# Patient Record
Sex: Female | Born: 1970 | Race: Black or African American | Hispanic: No | Marital: Married | Smoking: Never smoker
Health system: Southern US, Community
[De-identification: ages and names within clinical notes are randomized; demographics above are authoritative.]

## PROBLEM LIST (undated history)

## (undated) HISTORY — PX: WISDOM TOOTH EXTRACTION: SHX21

## (undated) HISTORY — PX: TOOTH EXTRACTION: SUR596

## (undated) HISTORY — PX: FOOT SURGERY: SHX648

---

## 2001-12-29 ENCOUNTER — Emergency Department (HOSPITAL_COMMUNITY): Admission: EM | Admit: 2001-12-29 | Discharge: 2001-12-29 | Payer: Self-pay | Admitting: *Deleted

## 2002-03-27 ENCOUNTER — Inpatient Hospital Stay (HOSPITAL_COMMUNITY): Admission: EM | Admit: 2002-03-27 | Discharge: 2002-03-27 | Payer: Self-pay

## 2004-11-26 ENCOUNTER — Emergency Department (HOSPITAL_COMMUNITY): Admission: EM | Admit: 2004-11-26 | Discharge: 2004-11-26 | Payer: Self-pay

## 2006-10-28 ENCOUNTER — Emergency Department (HOSPITAL_COMMUNITY): Admission: EM | Admit: 2006-10-28 | Discharge: 2006-10-28 | Payer: Self-pay | Admitting: Family Medicine

## 2006-10-31 ENCOUNTER — Emergency Department (HOSPITAL_COMMUNITY): Admission: EM | Admit: 2006-10-31 | Discharge: 2006-10-31 | Payer: Self-pay | Admitting: Family Medicine

## 2006-11-03 ENCOUNTER — Ambulatory Visit (HOSPITAL_COMMUNITY): Admission: RE | Admit: 2006-11-03 | Discharge: 2006-11-03 | Payer: Self-pay | Admitting: Obstetrics & Gynecology

## 2008-02-20 IMAGING — US US PELVIS COMPLETE MODIFY
1 series · 18 of 25 positions shown · non-contrast
Comparison: none

CLINICAL DATA: Evaluate for fibroids.  LMP 10/17/05. 
 TRANSABDOMINAL AND TRANSVAGINAL PELVIC ULTRASOUND:
TECHNIQUE: Both transabdominal and transvaginal ultrasound examinations of the pelvis were performed including evaluation of the uterus, ovaries, adnexal regions, and pelvic cul-de-sac.

[Series 1: us pelvis complete modify · 18 of 62 slices shown]
[im 1/62]
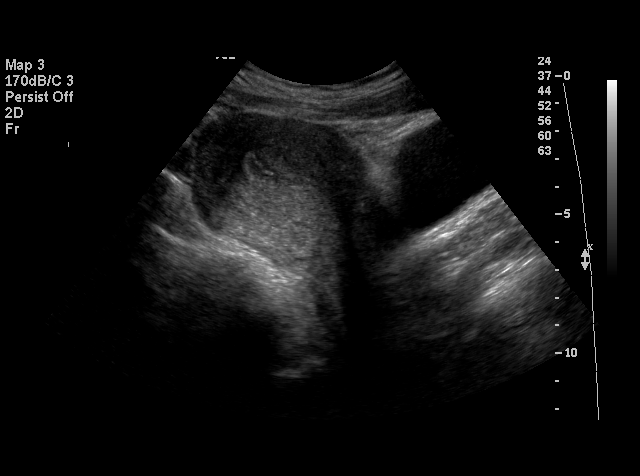
[im 6/62]
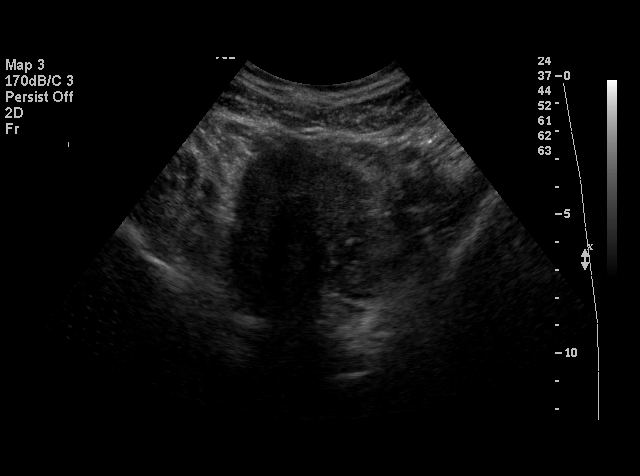
[im 8/62]
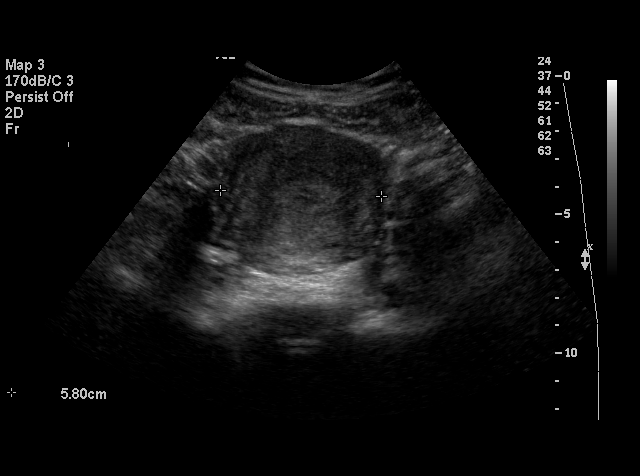
[im 11/62]
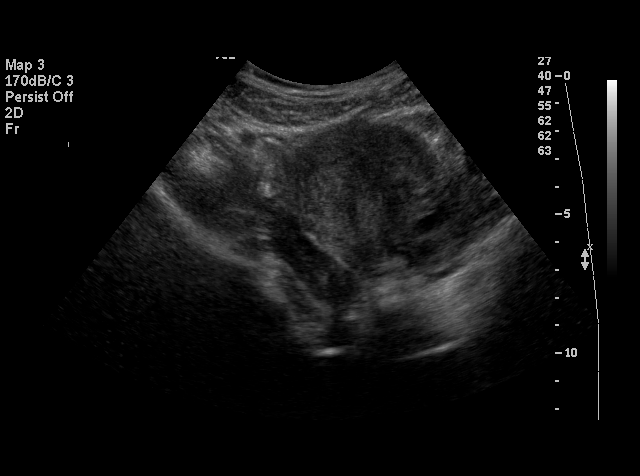
[im 16/62]
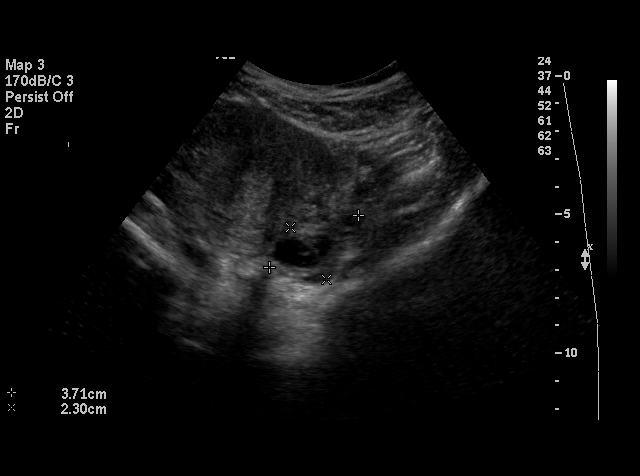
[im 18/62]
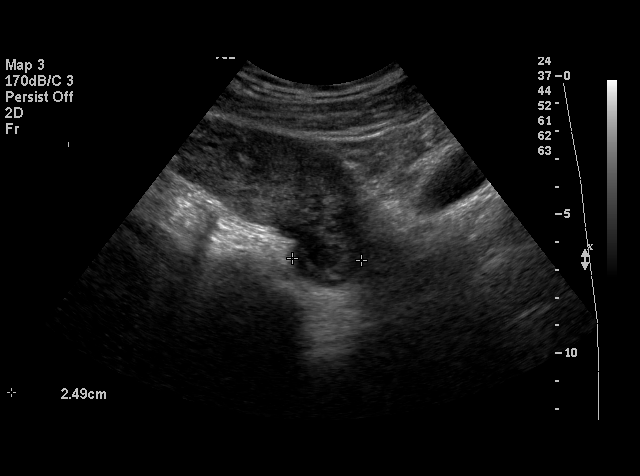
[im 23/62]
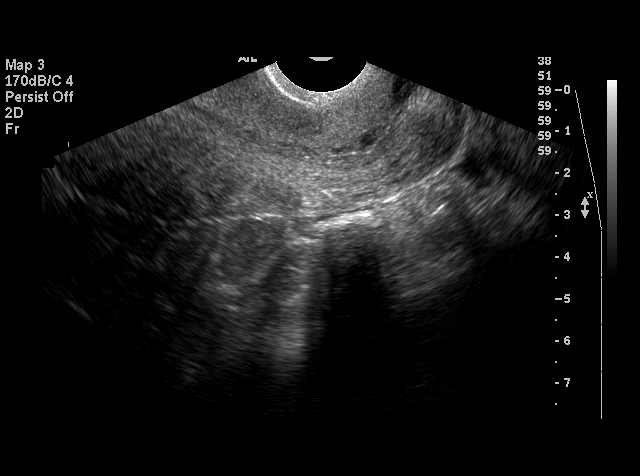
[im 26/62]
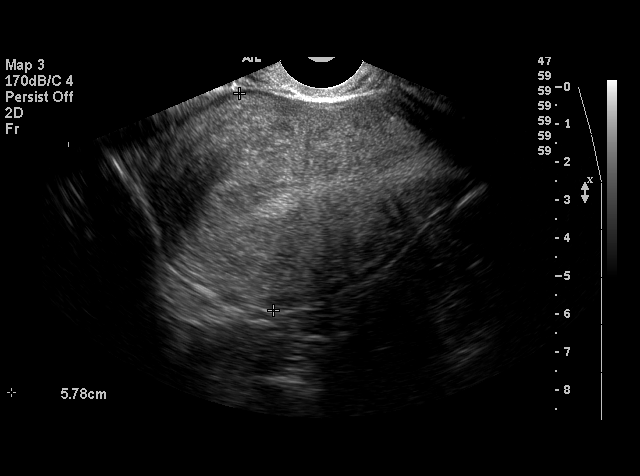
[im 28/62]
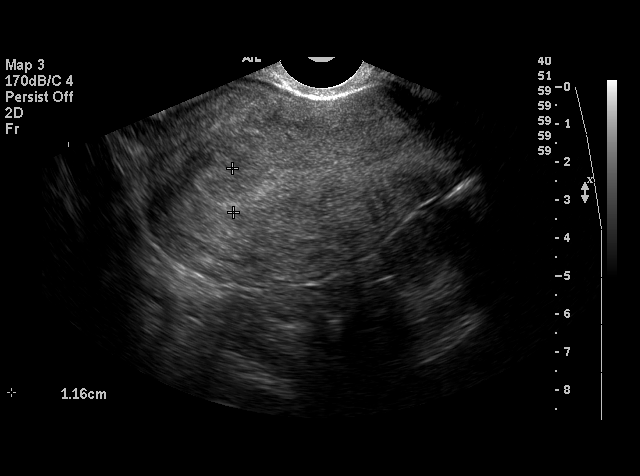
[im 34/62]
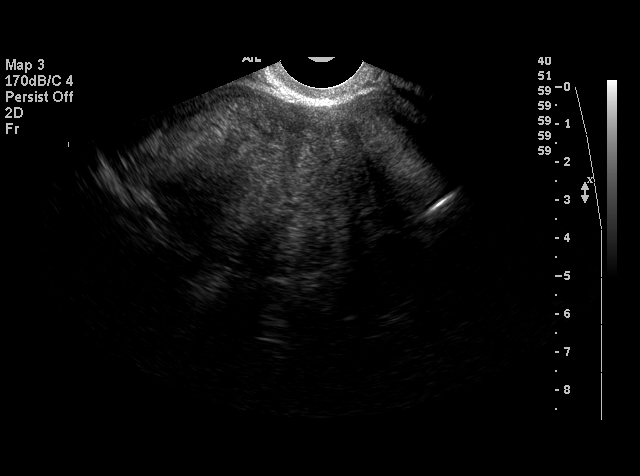
[im 36/62]
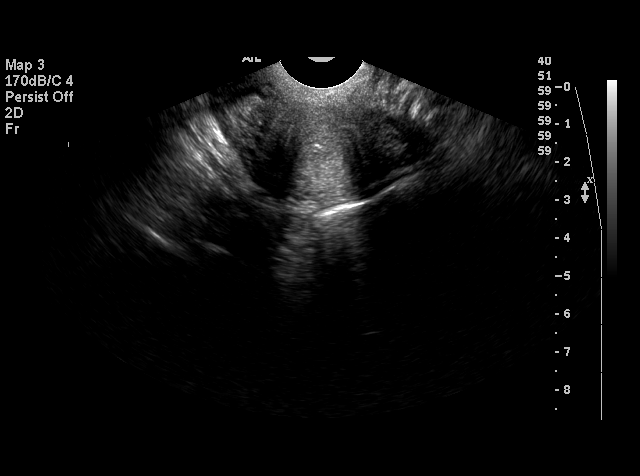
[im 39/62]
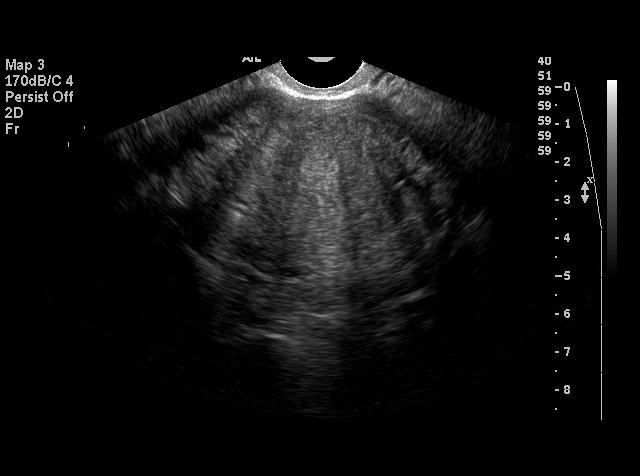
[im 44/62]
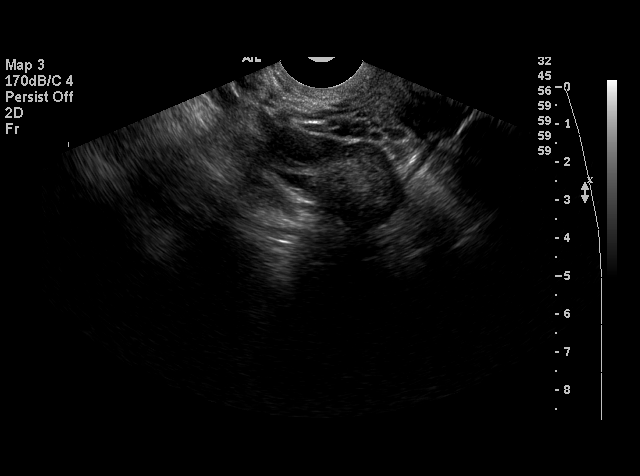
[im 46/62]
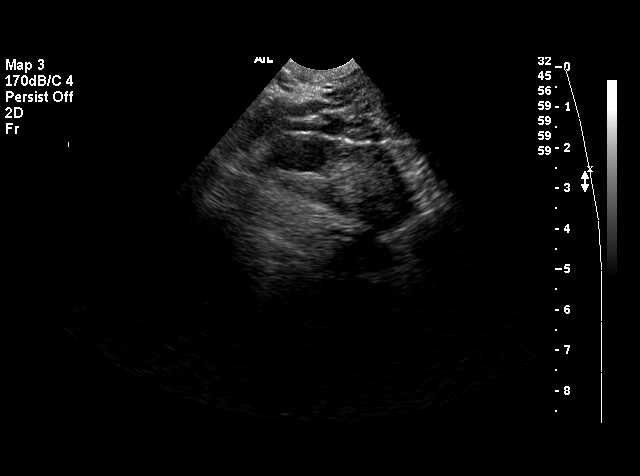
[im 51/62]
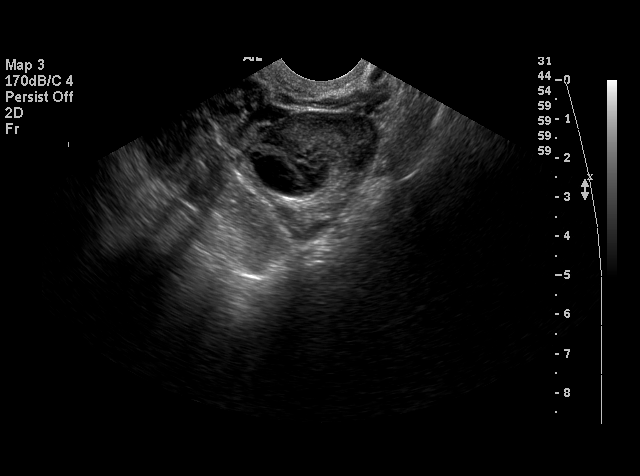
[im 54/62]
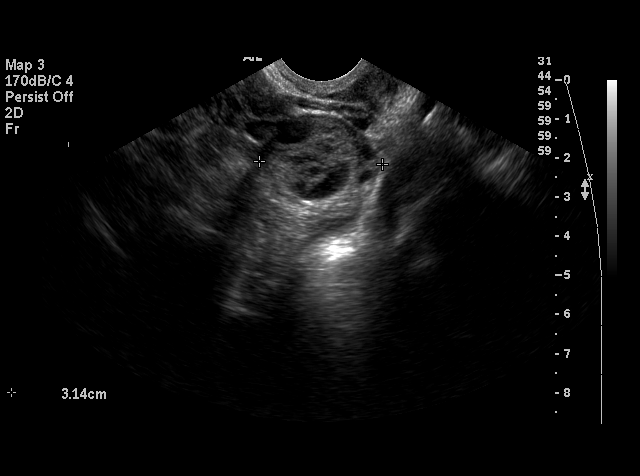
[im 56/62]
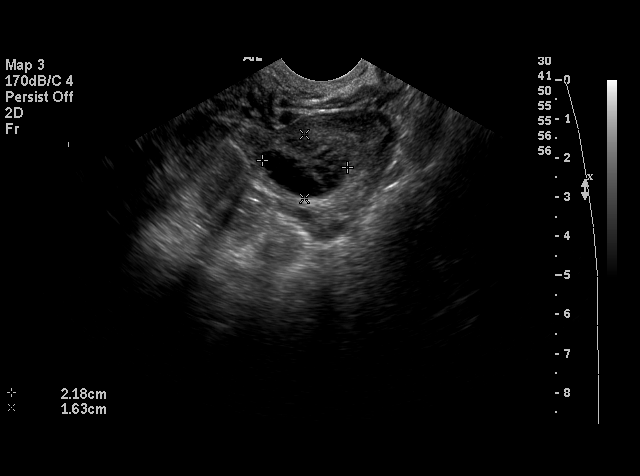
[im 62/62]
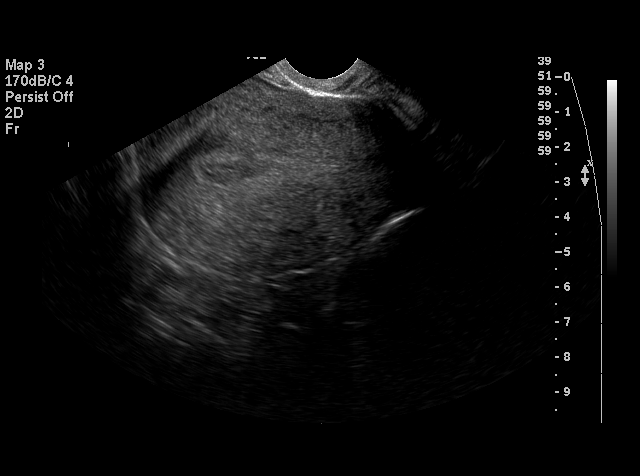

[18 of 25 positions shown; findings below may reference images not displayed]

FINDINGS: Multiple images of the uterus and adnexa were obtained using transabdominal and endovaginal approaches.  The uterus has a sagittal length of 9.9 cm an AP width of 5.8 cm and a transverse width of 6.0 cm.  No focal areas of altered echotexture are identified.  There is some linear shadowing identified from the anterior uterine myometrium and this can be seen in case of adenomyosis.  
 The endometrial lining is echogenic with an AP width of 1.2 cm.  No areas of focal thickening or inhomogeneity are seen and this would correlate with a presecretory endometrial stripe in the patient?s given LMP of 10/17/05. 
 Both ovaries are seen with the left ovary measuring 3.5 x 3.0 x 3.2 cm and containing a hemorrhagic corpus luteum cyst, also correlating with the patient?s presecretory phase of the cycle.  The right ovary has a normal appearance measuring 3.9 x 2.1 x 2.4 cm.  No cul-de-sac or periovarian fluid is seen and no separate adnexal masses are noted.
IMPRESSION: Slightly enlarged uterus demonstrating some shadowing from the anterior myometrium and raising the possibility of underlying adenomyosis.  Normal presecretory endometrial stripe and ovaries.

## 2009-11-10 ENCOUNTER — Emergency Department (HOSPITAL_COMMUNITY): Admission: EM | Admit: 2009-11-10 | Discharge: 2009-11-10 | Payer: Self-pay | Admitting: Emergency Medicine

## 2011-02-15 NOTE — Discharge Summary (Signed)
[facility]  Patient:    Angela Torres, Angela Torres Visit Number: [id number] MRN: [id number]          Service Type: MED Location: ICCU IC09 01 Attending Physician:  Renne Musca Dictated by:   Marcene Corning, M.D. Admit Date:  03/27/2002 Discharge Date: 03/27/2002                             Discharge Summary  DISCHARGE DIAGNOSES: 1. Polysubstance overdose with no suicidal ideation at the time of discharge.    No significant medical problems with this hospitalization. 2. Probable depression seen by mental health during this hospitalization.  She    has a follow up appointment with mental health at discharge. 3. Hypokalemia replaced p.o. during this hospitalization. 4. Alcohol abuse with no evidence of withdrawal during this hospitalization.  DISCHARGE MEDICATIONS:  None.  INSTRUCTIONS:  Follow up with Mental Health at discharge as per their recommendations.  Regular activity to be as tolerated.  HISTORY AND PHYSICAL:  Please refer to the dictated history and physical.  LABORATORY DATA AND X-RAYS:  CBC was normal with a white cell count of 8.0,hCG 13.3, platelets 242,000.  Her PT and PTT were normal.  Acetaminophen level was less than 10.  Urine pregnancy test was negative.  Salicylate level on admission was 6.2.  Her alcohol level was 15 on admission.   Her metabolic panel showed a low potassium of 3.2 but it mainly was normal including her liver function tests were normal.  Her BUN was 6, creatinine 0.8.  Urine toxicology screen was normal.  EKG showed questionable Q waves in V1 and V2 but otherwise no ischemic changes.  HOSPITAL COURSE: #1 - OVERDOSE:  Patient on admission had taken a questionable amount of muscle relaxers and she states she had taken some Tylenol and other medications. Questionable acetaminophens and some aspirin.  She called her husband and called the police.  On arrival to the emergency room she was intoxicated but medically  stable.  She is admitted to the ICU for close monitoring.  She did well overnight with no problems whatsoever.  Her laboratories were basically normal as above except for a slightly decreased potassium.  By the next morning she was awake, alert, and answering all my questions appropriately. She was not suicidal at that time.   She was seen by mental health and they have made arrangements for her to follow up with him as an outpatient. #2 - HYPOKALEMIA:  This was replaced p.o. during this hospitalization. Dictated by:   Marcene Corning, M.D. Attending Physician:  Renne Musca DD:  03/27/02 TD:  03/29/02 Job: [id number]

## 2011-02-15 NOTE — H&P (Signed)
[facility]  Patient:    Angela Torres, Angela Torres Visit Number: [id number] MRN: [id number]          Service Type: MED Location: ICCU IC09 01 Attending Physician:  Renne Musca Dictated by:   Renne Musca, M.D. Admit Date:  03/27/2002 Discharge Date: 03/27/2002                           History and Physical  DATE OF BIRTH:  1971-03-31  HISTORY OF PRESENT ILLNESS:  The patient is a 40 year old African American/Muslim female who took on a number of Soma, Valium, over-the-counter back ache relief pills, apparently only six or seven according to the patient.  She drank some wine after apparently an argument with her spouse.  She stated "after nothing happened" she called her husband and informed him of what she did as she was beginning to be fearful that she may have caused some damage to her liver.  He summoned EMS and she was brought to the emergency room where she was apparently quite combative and belligerent.  She received charcoal and by the time I was called, approximately one hour later, she was awake, alert, quite remorseful regarding her attempted gesture, but very appropriate. Apparently, she has had multiple social stressors including financial issues, relationship issues, and she felt that this was a sound way to handle it at the time, although she realizes now that that is not appropriate.  She describes it as a "brief psychotic moment." The patient has no previous history of mental illness or suicide gestures.  She is actually quite healthy without any significant problems.  MEDICATIONS:  Takes no routine medications.  ALLERGIES:  AMPICILLIN.  REVIEW OF SYSTEMS:  Essentially unremarkable with the exception of current menses.  She does note some mood changes premenstrually which has been consistent.  Otherwise, her review of systems is negative.  SOCIAL HISTORY:  She is married, three children 7, 9, and 50.  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  No tobacco.  Occasional alcohol, but no binge drinking per se.  Her husband is a Naval architect and is on the road a lot.  FAMILY HISTORY:  Mother died age 29 of asthma.  Father is apparently living, though she does not know much about his specifics.  He has lung cancer.  PHYSICAL EXAMINATION  GENERAL:  Well-developed, well-nourished African-American female in no distress.  VITAL SIGNS:  Blood pressure 120/70, pulse in the 80s and regular, respirations unlabored.  Examination is essentially normal.  She is very talkative.  LABORATORY DATA:  Alcohol 15, otherwise unremarkable with the exception of potassium 3.2.  ASSESSMENT AND PLAN: 1. Suicidal gesture in response to multiple social stressors.  She clearly has    some unresolved issues when discussing her mothers death.  An ACT referral    has been made.  No changes at the time. 2. Hypokalemia, p.o. supplementation. Dictated by:   Renne Musca, M.D. Attending Physician:  Renne Musca DD:  03/27/02 TD:  03/29/02 Job: [id number]

## 2012-02-18 ENCOUNTER — Emergency Department (HOSPITAL_COMMUNITY)
Admission: EM | Admit: 2012-02-18 | Discharge: 2012-02-18 | Disposition: A | Discharge status: Home / Self Care | Payer: Medicaid Other | Source: Home / Self Care | Attending: Family Medicine | Admitting: Family Medicine

## 2012-02-18 ENCOUNTER — Encounter (HOSPITAL_COMMUNITY): Payer: Self-pay | Admitting: *Deleted

## 2012-02-18 DIAGNOSIS — N3090 Cystitis, unspecified without hematuria: Secondary | ICD-10-CM

## 2012-02-18 DIAGNOSIS — N3091 Cystitis, unspecified with hematuria: Secondary | ICD-10-CM

## 2012-02-18 LAB — POCT URINALYSIS DIP (DEVICE)
Glucose, UA: NEGATIVE mg/dL
Nitrite: NEGATIVE
Urobilinogen, UA: 1 mg/dL (ref 0.0–1.0)

## 2012-02-18 MED ORDER — PHENAZOPYRIDINE HCL 200 MG PO TABS: 200.0000 mg | ORAL_TABLET | Freq: Three times a day (TID) | ORAL | Status: AC | PRN

## 2012-02-18 MED ORDER — SULFAMETHOXAZOLE-TRIMETHOPRIM 800-160 MG PO TABS: 1.0000 | ORAL_TABLET | Freq: Two times a day (BID) | ORAL | Status: AC

## 2012-02-18 NOTE — Discharge Instructions (Signed)
Your urine is abnormal and findings are consistent with a urinary tract infection. Your pregnancy test is negative. Keep well-hydrated. Take the prescribed medications as instructed. Return if worsening symptoms like new onset of fever despite following treatment.

## 2012-02-18 NOTE — ED Notes (Signed)
Pt  Has  Symptoms  Of  Frequency  hesitency  As  Well  As  Burning on urination     X  3  Days  She  Also  Reports  Symptoms  Of  Low  abd  Cramping as  Well  -    The  Pt  denys  Any other gyn  Symptoms

## 2012-02-21 ENCOUNTER — Telehealth (HOSPITAL_COMMUNITY): Payer: Self-pay | Admitting: *Deleted

## 2012-02-21 LAB — URINE CULTURE: Colony Count: >=100000

## 2012-02-21 NOTE — ED Notes (Signed)
Urine culture: >100,000 colonies E. Coli. Pt. treated with Septra- resistant. Pt. allergic to PCN. Lab shown to Dr. Ladon Applebaum and order obtained for Macrobid 100 mg 1 tab po BID x 7 days. I called and left message on home and cell #. Pt. called back.  Pt. verified x 2 and given results.  Pt. told the Septra is resistant to the bacteria and we need to change her to Macrobid. Pt.wants Rx. called to [facility] on [address].  Rx. called to pharmacist at [phone]. Vassie Moselle 02/21/2012

## 2012-02-21 NOTE — ED Provider Notes (Signed)
History     CSN: [id number]  Arrival date & time 02/18/12  1734   First MD Initiated Contact with Patient 02/18/12 1747      Chief Complaint  Patient presents with  . Urinary Tract Infection    (Consider location/radiation/quality/duration/timing/severity/associated sxs/prior treatment) HPI Comments: 41 y/o female with no significant PMH here c/o 3 days with burning on urination, frequency and tenesmus. Also noted blood in her urine. Denies pelvic pain or vaginal discharge. Denies h/o STD's. States she had recently increased sexual activity during husband's birthday prior to her symptoms. Denies headache, nausea or vomiting no fever or chills.   History reviewed. No pertinent past medical history.  Past Surgical History  Procedure Date  . Foot surgery     History reviewed. No pertinent family history.  History  Substance Use Topics  . Smoking status: Not on file  . Smokeless tobacco: Not on file  . Alcohol Use:     OB History    Grav Para Term Preterm Abortions TAB SAB Ect Mult Living                  Review of Systems  Constitutional: Negative for fever, chills and diaphoresis.  Genitourinary: Positive for dysuria, frequency and hematuria. Negative for flank pain, vaginal bleeding, vaginal discharge, genital sores and pelvic pain.  Musculoskeletal: Negative for back pain.  Neurological: Negative for dizziness and headaches.  All other systems reviewed and are negative.    Allergies  Penicillins  Home Medications   Current Outpatient Rx  Name Route Sig Dispense Refill  . PHENAZOPYRIDINE HCL 200 MG PO TABS Oral Take 1 tablet (200 mg total) by mouth 3 (three) times daily as needed for pain. 6 tablet 0  . SULFAMETHOXAZOLE-TRIMETHOPRIM 800-160 MG PO TABS Oral Take 1 tablet by mouth 2 (two) times daily. 10 tablet 0    BP 124/88  Pulse 72  Temp(Src) 100 F (37.8 C) (Oral)  Resp 16  SpO2 98%  LMP 01/24/2012  Physical Exam  Nursing note and vitals  reviewed. Constitutional: She is oriented to person, place, and time. She appears well-developed and well-nourished. No distress.  HENT:  Head: Normocephalic and atraumatic.  Mouth/Throat: Oropharynx is clear and moist.  Eyes: Conjunctivae and EOM are normal. Pupils are equal, round, and reactive to light.  Neck: Normal range of motion. Neck supple.  Cardiovascular: Normal heart sounds.   Pulmonary/Chest: Breath sounds normal.  Abdominal: Soft. There is no tenderness.       No CVT  Lymphadenopathy:    She has no cervical adenopathy.  Neurological: She is alert and oriented to person, place, and time.  Skin: No rash noted.    ED Course  Procedures (including critical care time)  Labs Reviewed  POCT URINALYSIS DIP (DEVICE) - Abnormal; Notable for the following:    Bilirubin Urine SMALL (*)    Hgb urine dipstick LARGE (*)    pH 8.5 (*)    Protein, ur >=300 (*)    Leukocytes, UA MODERATE (*) Biochemical Testing Only. Please order routine urinalysis from main lab if confirmatory testing is needed.   All other components within normal limits  POCT PREGNANCY, URINE  URINE CULTURE   No results found.   1. Cystitis with hematuria       MDM  Non complicated hemorragic cystitis. Urine culture pending at time of discharge. Treated with septra and pyridium. Discussed supportive and preventive care as well.        Sharin Grave, MD 02/21/12  0101 

## 2012-02-22 MED ORDER — NITROFURANTOIN MONOHYD MACRO 100 MG PO CAPS: 100.0000 mg | ORAL_CAPSULE | Freq: Two times a day (BID) | ORAL | Status: AC

## 2012-07-02 ENCOUNTER — Encounter (HOSPITAL_COMMUNITY): Payer: Self-pay | Admitting: Family Medicine

## 2012-07-02 ENCOUNTER — Emergency Department (HOSPITAL_COMMUNITY): Payer: Medicaid Other

## 2012-07-02 ENCOUNTER — Emergency Department (HOSPITAL_COMMUNITY)
Admission: EM | Admit: 2012-07-02 | Discharge: 2012-07-02 | Disposition: A | Discharge status: Home / Self Care | Payer: Medicaid Other | Attending: Emergency Medicine | Admitting: Emergency Medicine

## 2012-07-02 DIAGNOSIS — R0789 Other chest pain: Secondary | ICD-10-CM

## 2012-07-02 DIAGNOSIS — R071 Chest pain on breathing: Secondary | ICD-10-CM | POA: Insufficient documentation

## 2012-07-02 LAB — CBC WITH DIFFERENTIAL/PLATELET
Basophils Absolute: 0.1 10*3/uL (ref 0.0–0.1)
Basophils Relative: 1 % (ref 0–1)
MCH: 30.3 pg (ref 26.0–34.0)
MCHC: 34.9 g/dL (ref 30.0–36.0)
MCV: 86.7 fL (ref 78.0–100.0)
Monocytes Absolute: 0.5 10*3/uL (ref 0.1–1.0)
Monocytes Relative: 7 % (ref 3–12)
Neutrophils Relative %: 40 % — ABNORMAL LOW (ref 43–77)
WBC: 6.7 10*3/uL (ref 4.0–10.5)

## 2012-07-02 LAB — COMPREHENSIVE METABOLIC PANEL
ALT: 9 U/L (ref 0–35)
AST: 13 U/L (ref 0–37)
CO2: 26 mEq/L (ref 19–32)
Chloride: 104 mEq/L (ref 96–112)
Creatinine, Ser: 0.84 mg/dL (ref 0.50–1.10)
GFR calc Af Amer: >90 mL/min (ref >90)

## 2012-07-02 LAB — POCT I-STAT TROPONIN I: Troponin i, poc: 0.01 ng/mL (ref 0.00–0.08)

## 2012-07-02 MED ORDER — TRAMADOL HCL 50 MG PO TABS: 50.0000 mg | ORAL_TABLET | Freq: Four times a day (QID) | ORAL | Status: DC | PRN

## 2012-07-02 NOTE — ED Notes (Signed)
Pt presents to department for evaluation of L sided chest pain. Describes as constant soreness. 7/10 pain at the time, becomes worse with deep breathing. Lung sounds clear and equal bilaterally. Respirations unlabored. Also states cough, and diaphoresis since Tuesday. Pt is alert and oriented x4. No signs of distress noted at the time.

## 2012-07-02 NOTE — ED Provider Notes (Cosign Needed)
History     CSN: [id number]  Arrival date & time 07/02/12  0945   First MD Initiated Contact with Patient 07/02/12 1013      Chief Complaint  Patient presents with  . Chest Pain    (Consider location/radiation/quality/duration/timing/severity/associated sxs/prior treatment) Patient is a 41 y.o. female presenting with chest pain. The history is provided by the patient (pt has had a cough and now has some chest pain). No language interpreter was used.  Chest Pain The chest pain began 12 - 24 hours ago. Chest pain occurs frequently. The chest pain is unchanged. Associated with: movement. At its most intense, the pain is at 4/10. The pain is currently at 2/10. The severity of the pain is moderate. The quality of the pain is described as aching. The pain does not radiate. Chest pain is worsened by exertion. Pertinent negatives for primary symptoms include no fever, no fatigue, no cough and no abdominal pain. She tried nothing for the symptoms. Risk factors include no known risk factors.  Pertinent negatives for past medical history include no aneurysm and no seizures.     History reviewed. No pertinent past medical history.  Past Surgical History  Procedure Date  . Foot surgery     History reviewed. No pertinent family history.  History  Substance Use Topics  . Smoking status: Never Smoker   . Smokeless tobacco: Not on file  . Alcohol Use: Yes     social    OB History    Grav Para Term Preterm Abortions TAB SAB Ect Mult Living                  Review of Systems  Constitutional: Negative for fever and fatigue.  HENT: Negative for congestion, sinus pressure and ear discharge.   Eyes: Negative for discharge.  Respiratory: Negative for cough.   Cardiovascular: Positive for chest pain.  Gastrointestinal: Negative for abdominal pain and diarrhea.  Genitourinary: Negative for frequency and hematuria.  Musculoskeletal: Negative for back pain.  Skin: Negative for rash.    Neurological: Negative for seizures and headaches.  Hematological: Negative.   Psychiatric/Behavioral: Negative for hallucinations.    Allergies  Penicillins  Home Medications   Current Outpatient Rx  Name Route Sig Dispense Refill  . B COMPLEX PO TABS Oral Take 1 tablet by mouth daily.    Marland Kitchen CALCIUM + D PO Oral Take 1 tablet by mouth 2 (two) times daily.    . ETONOGESTREL-ETHINYL ESTRADIOL 0.12-0.015 MG/24HR VA RING Vaginal Place 1 each vaginally every 28 (twenty-eight) days. Insert vaginally and leave in place for 3 consecutive weeks, then remove for 1 week.    Marland Kitchen MAGNESIUM 30 MG PO TABS Oral Take 30 mg by mouth 2 (two) times daily.    . TRAMADOL HCL 50 MG PO TABS Oral Take 1 tablet (50 mg total) by mouth every 6 (six) hours as needed for pain. 15 tablet 0    BP 124/76  Pulse 94  Temp 98.2 F (36.8 C) (Oral)  Resp 18  SpO2 100%  LMP 06/17/2012  Physical Exam  Constitutional: She is oriented to person, place, and time. She appears well-developed.  HENT:  Head: Normocephalic and atraumatic.  Eyes: Conjunctivae normal and EOM are normal. No scleral icterus.  Neck: Neck supple. No thyromegaly present.  Cardiovascular: Normal rate and regular rhythm.  Exam reveals no gallop and no friction rub.   No murmur heard. Pulmonary/Chest: No stridor. She has no wheezes. She has no rales. She exhibits  tenderness.  Abdominal: She exhibits no distension. There is no tenderness. There is no rebound.  Musculoskeletal: Normal range of motion. She exhibits no edema.  Lymphadenopathy:    She has no cervical adenopathy.  Neurological: She is oriented to person, place, and time. Coordination normal.  Skin: No rash noted. No erythema.  Psychiatric: She has a normal mood and affect. Her behavior is normal.    ED Course  Procedures (including critical care time)  Labs Reviewed  CBC WITH DIFFERENTIAL - Abnormal; Notable for the following:    Neutrophils Relative 40 (*)     Lymphocytes  Relative 48 (*)     All other components within normal limits  COMPREHENSIVE METABOLIC PANEL - Abnormal; Notable for the following:    Glucose, Bld 103 (*)     GFR calc non Af Amer 86 (*)     All other components within normal limits  POCT I-STAT TROPONIN I   Dg Chest 2 View  07/02/2012  *RADIOLOGY REPORT*  Clinical Data: Left chest pain.  CHEST - 2 VIEW  Comparison: None.  Findings: Lungs are clear.  No pneumothorax or pleural fluid. Heart size is normal.  No bony abnormality.  IMPRESSION: Negative exam.   Original Report Authenticated By: Bernadene Bell. D'ALESSIO, M.D.      1. Chest wall pain       MDM  The chart was scribed for me under my direct supervision.  I personally performed the history, physical, and medical decision making and all procedures in the evaluation of this patient.Benny Lennert, MD 07/02/12 1215

## 2012-07-02 NOTE — ED Notes (Signed)
Per pt sts a few days of cough, HA, chills, and not feeling well. sts she woke up this am and stretched and felt pain in her left chest. sts also hurts when she breathes.

## 2013-06-21 ENCOUNTER — Other Ambulatory Visit: Payer: Self-pay | Admitting: *Deleted

## 2013-06-21 MED ORDER — ETONOGESTREL-ETHINYL ESTRADIOL 0.12-0.015 MG/24HR VA RING: VAGINAL_RING | VAGINAL | Status: DC

## 2013-06-21 NOTE — Progress Notes (Signed)
Patient called for refills for her Nuva Ring - script sent to pharmacy with 11 refills.  Last annual 03/2012.

## 2013-07-15 ENCOUNTER — Ambulatory Visit: Payer: Self-pay | Admitting: Obstetrics & Gynecology

## 2013-08-30 ENCOUNTER — Ambulatory Visit: Payer: Medicaid Other | Admitting: Obstetrics & Gynecology

## 2013-09-09 ENCOUNTER — Ambulatory Visit (INDEPENDENT_AMBULATORY_CARE_PROVIDER_SITE_OTHER): Payer: Medicaid Other | Admitting: Obstetrics & Gynecology

## 2013-09-09 ENCOUNTER — Encounter: Payer: Self-pay | Admitting: Obstetrics & Gynecology

## 2013-09-09 VITALS — BP 121/79 | HR 98 | Temp 99.2°F | Ht 67.0 in | Wt 186.0 lb

## 2013-09-09 DIAGNOSIS — Z0000 Encounter for general adult medical examination without abnormal findings: Secondary | ICD-10-CM

## 2013-09-09 LAB — POCT URINALYSIS DIPSTICK
Bilirubin, UA: NEGATIVE
Blood, UA: NEGATIVE
Glucose, UA: NEGATIVE
Ketones, UA: NEGATIVE
Urobilinogen, UA: NEGATIVE
pH, UA: 8

## 2013-09-09 NOTE — Addendum Note (Signed)
Addended by: Elby Beck F on: 09/09/2013 05:51 PM   Modules accepted: Orders

## 2013-09-09 NOTE — Progress Notes (Signed)
Subjective:     Angela Torres is a 42 y.o. female here for a routine exam.  Current complaints: denies.  Personal health questionnaire reviewed: yes.   Gynecologic History No LMP recorded. Patient is not currently having periods (Reason: Other). Contraception: NuvaRing vaginal inserts Last Pap: 2013. Results were: normal Last mammogram: pt state she will schedule today  Obstetric History OB History  No data available     The following portions of the patient's history were reviewed and updated as appropriate: allergies, current medications, past family history, past medical history, past social history, past surgical history and problem list.  Review of Systems Pertinent items are noted in HPI.    Objective:    General appearance: alert Breasts: normal appearance, no masses or tenderness Abdomen: soft, non-tender; bowel sounds normal; no masses,  no organomegaly Pelvic: cervix normal in appearance, external genitalia normal, no adnexal masses or tenderness, uterus upper limits of normal size, shape, and consistency and vagina normal without discharge      Assessment:    Healthy female exam.    Plan:    Screening MMG Return for Mirena IUD

## 2013-09-09 NOTE — Patient Instructions (Signed)
Levonorgestrel intrauterine device (IUD) What is this medicine? LEVONORGESTREL IUD (LEE voe nor jes trel) is a contraceptive (birth control) device. The device is placed inside the uterus by a healthcare professional. It is used to prevent pregnancy and can also be used to treat heavy bleeding that occurs during your period. Depending on the device, it can be used for 3 to 5 years. This medicine may be used for other purposes; ask your health care provider or pharmacist if you have questions. COMMON BRAND NAME(S): Jerral Bonito What should I tell my health care provider before I take this medicine? They need to know if you have any of these conditions: -abnormal Pap smear -cancer of the breast, uterus, or cervix -diabetes -endometritis -genital or pelvic infection now or in the past -have more than one sexual partner or your partner has more than one partner -heart disease -history of an ectopic or tubal pregnancy -immune system problems -IUD in place -liver disease or tumor -problems with blood clots or take blood-thinners -use intravenous drugs -uterus of unusual shape -vaginal bleeding that has not been explained -an unusual or allergic reaction to levonorgestrel, other hormones, silicone, or polyethylene, medicines, foods, dyes, or preservatives -pregnant or trying to get pregnant -breast-feeding How should I use this medicine? This device is placed inside the uterus by a health care professional. Talk to your pediatrician regarding the use of this medicine in children. Special care may be needed. Overdosage: If you think you have taken too much of this medicine contact a poison control center or emergency room at once. NOTE: This medicine is only for you. Do not share this medicine with others. What if I miss a dose? This does not apply. What may interact with this medicine? Do not take this medicine with any of the following  medications: -amprenavir -bosentan -fosamprenavir This medicine may also interact with the following medications: -aprepitant -barbiturate medicines for inducing sleep or treating seizures -bexarotene -griseofulvin -medicines to treat seizures like carbamazepine, ethotoin, felbamate, oxcarbazepine, phenytoin, topiramate -modafinil -pioglitazone -rifabutin -rifampin -rifapentine -some medicines to treat HIV infection like atazanavir, indinavir, lopinavir, nelfinavir, tipranavir, ritonavir -St. John's wort -warfarin This list may not describe all possible interactions. Give your health care provider a list of all the medicines, herbs, non-prescription drugs, or dietary supplements you use. Also tell them if you smoke, drink alcohol, or use illegal drugs. Some items may interact with your medicine. What should I watch for while using this medicine? Visit your doctor or health care professional for regular check ups. See your doctor if you or your partner has sexual contact with others, becomes HIV positive, or gets a sexual transmitted disease. This product does not protect you against HIV infection (AIDS) or other sexually transmitted diseases. You can check the placement of the IUD yourself by reaching up to the top of your vagina with clean fingers to feel the threads. Do not pull on the threads. It is a good habit to check placement after each menstrual period. Call your doctor right away if you feel more of the IUD than just the threads or if you cannot feel the threads at all. The IUD may come out by itself. You may become pregnant if the device comes out. If you notice that the IUD has come out use a backup birth control method like condoms and call your health care provider. Using tampons will not change the position of the IUD and are okay to use during your period. What side effects may I  notice from receiving this medicine? Side effects that you should report to your doctor or  health care professional as soon as possible: -allergic reactions like skin rash, itching or hives, swelling of the face, lips, or tongue -fever, flu-like symptoms -genital sores -high blood pressure -no menstrual period for 6 weeks during use -pain, swelling, warmth in the leg -pelvic pain or tenderness -severe or sudden headache -signs of pregnancy -stomach cramping -sudden shortness of breath -trouble with balance, talking, or walking -unusual vaginal bleeding, discharge -yellowing of the eyes or skin Side effects that usually do not require medical attention (report to your doctor or health care professional if they continue or are bothersome): -acne -breast pain -change in sex drive or performance -changes in weight -cramping, dizziness, or faintness while the device is being inserted -headache -irregular menstrual bleeding within first 3 to 6 months of use -nausea This list may not describe all possible side effects. Call your doctor for medical advice about side effects. You may report side effects to FDA at 1-800-FDA-1088. Where should I keep my medicine? This does not apply. NOTE: This sheet is a summary. It may not cover all possible information. If you have questions about this medicine, talk to your doctor, pharmacist, or health care provider.  2014, Elsevier/Gold Standard. (2011-10-17 13:54:04) Mammography Mammography is an X-ray of the breasts to look for changes that are not normal. The X-ray image is called a mammogram. This procedure can screen for breast cancer, can detect cancer early, and can diagnose cancer.  LET YOUR CAREGIVER KNOW ABOUT:  Breast implants.  Previous breast disease, biopsy, or surgery.  If you are breastfeeding.  Medicines taken, including vitamins, herbs, eyedrops, over-the-counter medicines, and creams.  Use of steroids (by mouth or creams).  Possibility of pregnancy, if this applies. RISKS AND COMPLICATIONS  Exposure to  radiation, but at very low levels.  The results may be misinterpreted.  The results may not be accurate.  Mammography may lead to further tests.  Mammography may not catch certain cancers. BEFORE THE PROCEDURE  Schedule your test about 7 days after your menstrual period. This is when your breasts are the least tender and have signs of hormone changes.  If you have had a mammography done at a different facility in the past, get the mammogram X-rays or have them sent to your current exam facility in order to compare them.  Wash your breasts and under your arms the day of the test.  Do not wear deodorants, perfumes, or powders anywhere on your body.  Wear clothes that you can change in and out of easily. PROCEDURE Relax as much as possible during the test. Any discomfort during the test will be very brief. The test should take less than 30 minutes. The following will happen:  You will undress from the waist up and put on a gown.  You will stand in front of the X-ray machine.  Each breast will be placed between 2 plastic or glass plates. The plates will compress your breast for a few seconds.  X-rays will be taken from different angles of the breast. AFTER THE PROCEDURE  The mammogram will be examined.  Depending on the quality of the images, you may need to repeat certain parts of the test.  Ask when your test results will be ready. Make sure you get your test results.  You may resume normal activities. Document Released: 09/13/2000 Document Revised: 12/09/2011 Document Reviewed: 07/07/2011 [facility] Patient Information 2014 [place], [place].

## 2013-09-10 ENCOUNTER — Other Ambulatory Visit: Payer: Self-pay | Admitting: Obstetrics & Gynecology

## 2013-09-10 DIAGNOSIS — Z1231 Encounter for screening mammogram for malignant neoplasm of breast: Secondary | ICD-10-CM

## 2013-09-10 LAB — GC/CHLAMYDIA PROBE AMP
CT Probe RNA: NEGATIVE
GC Probe RNA: NEGATIVE

## 2013-09-27 ENCOUNTER — Ambulatory Visit: Payer: Medicaid Other | Admitting: Obstetrics & Gynecology

## 2013-09-29 ENCOUNTER — Ambulatory Visit: Payer: Medicaid Other | Admitting: Obstetrics & Gynecology

## 2013-10-19 IMAGING — CR DG CHEST 2V
2 series · 2 of 2 positions shown · non-contrast
Comparison: None.

CLINICAL DATA: Left chest pain.

CHEST - 2 VIEW

[w chest pa]
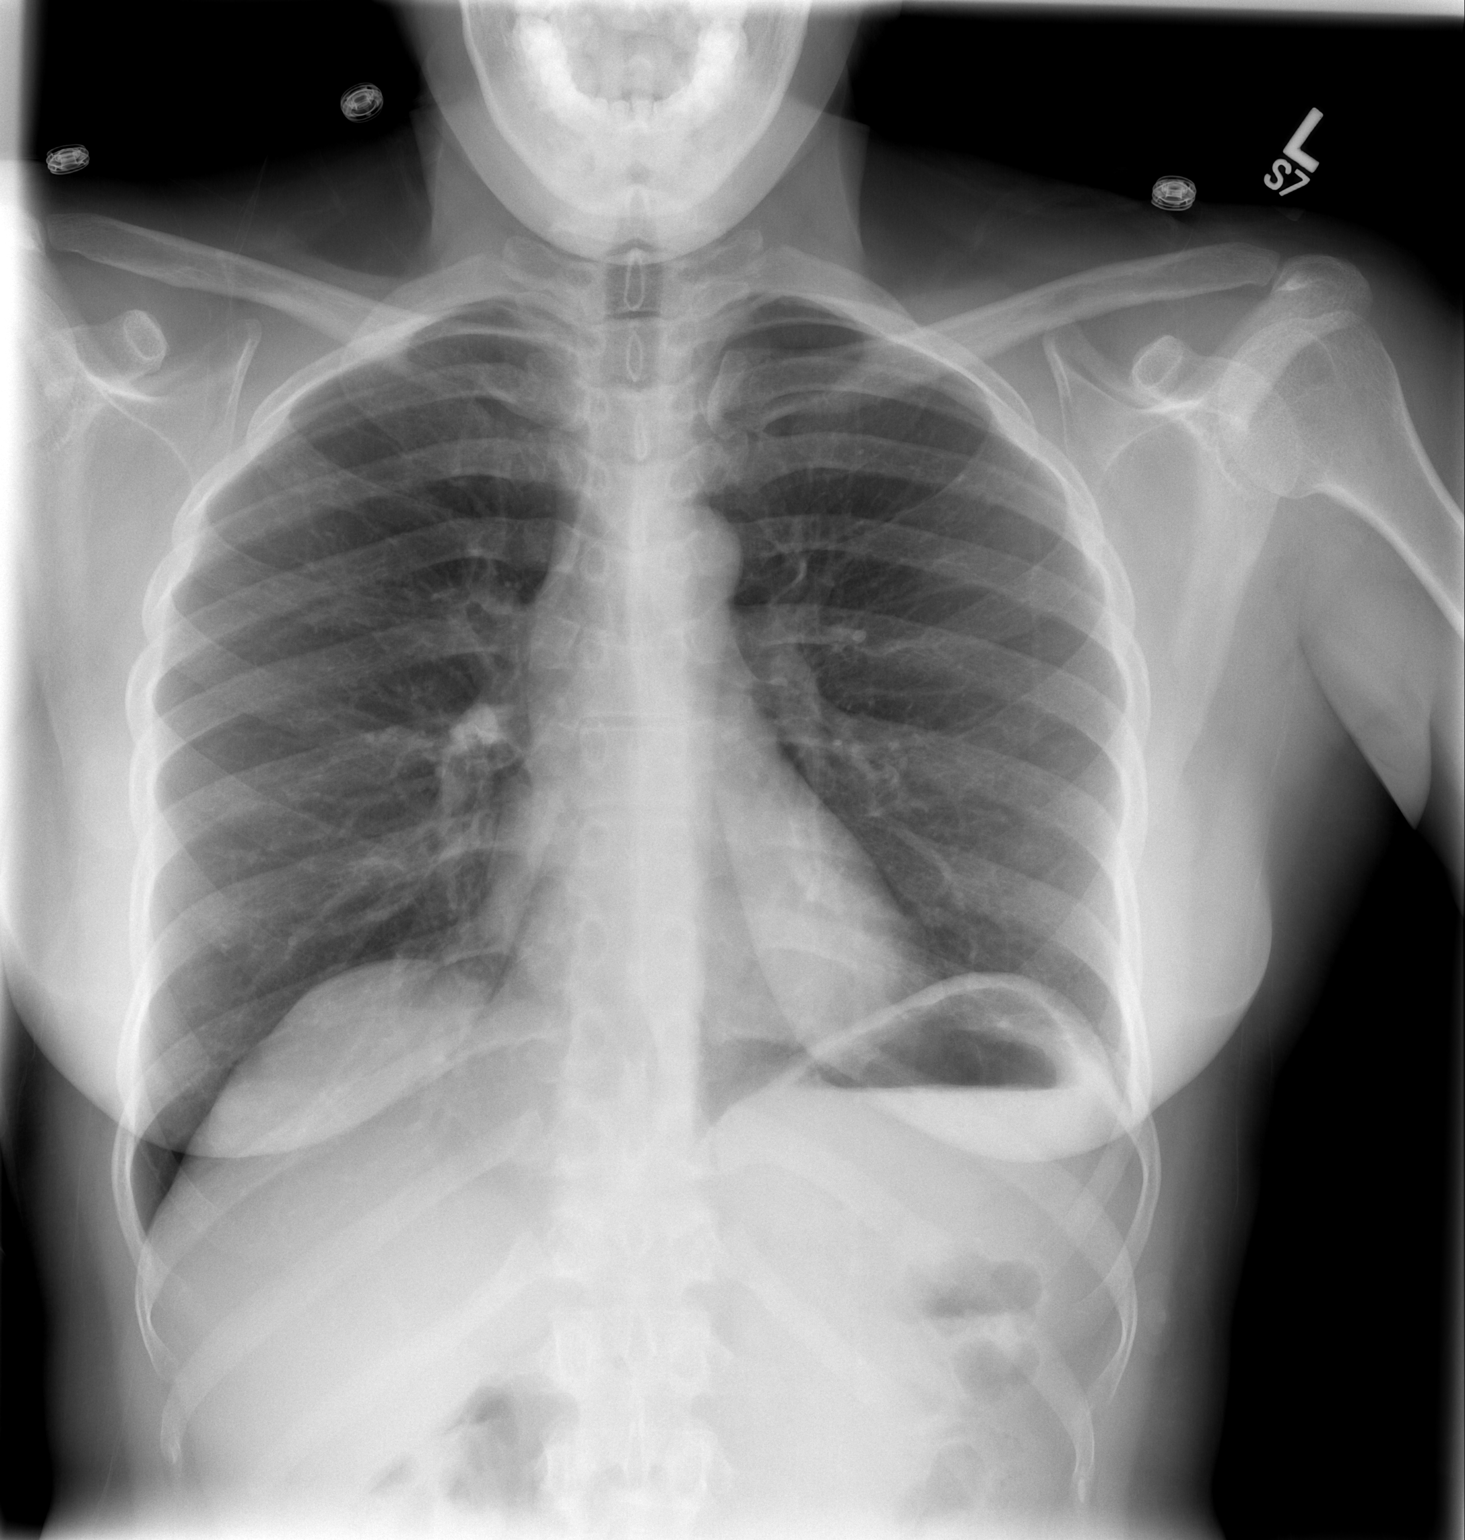

[w chest lat]
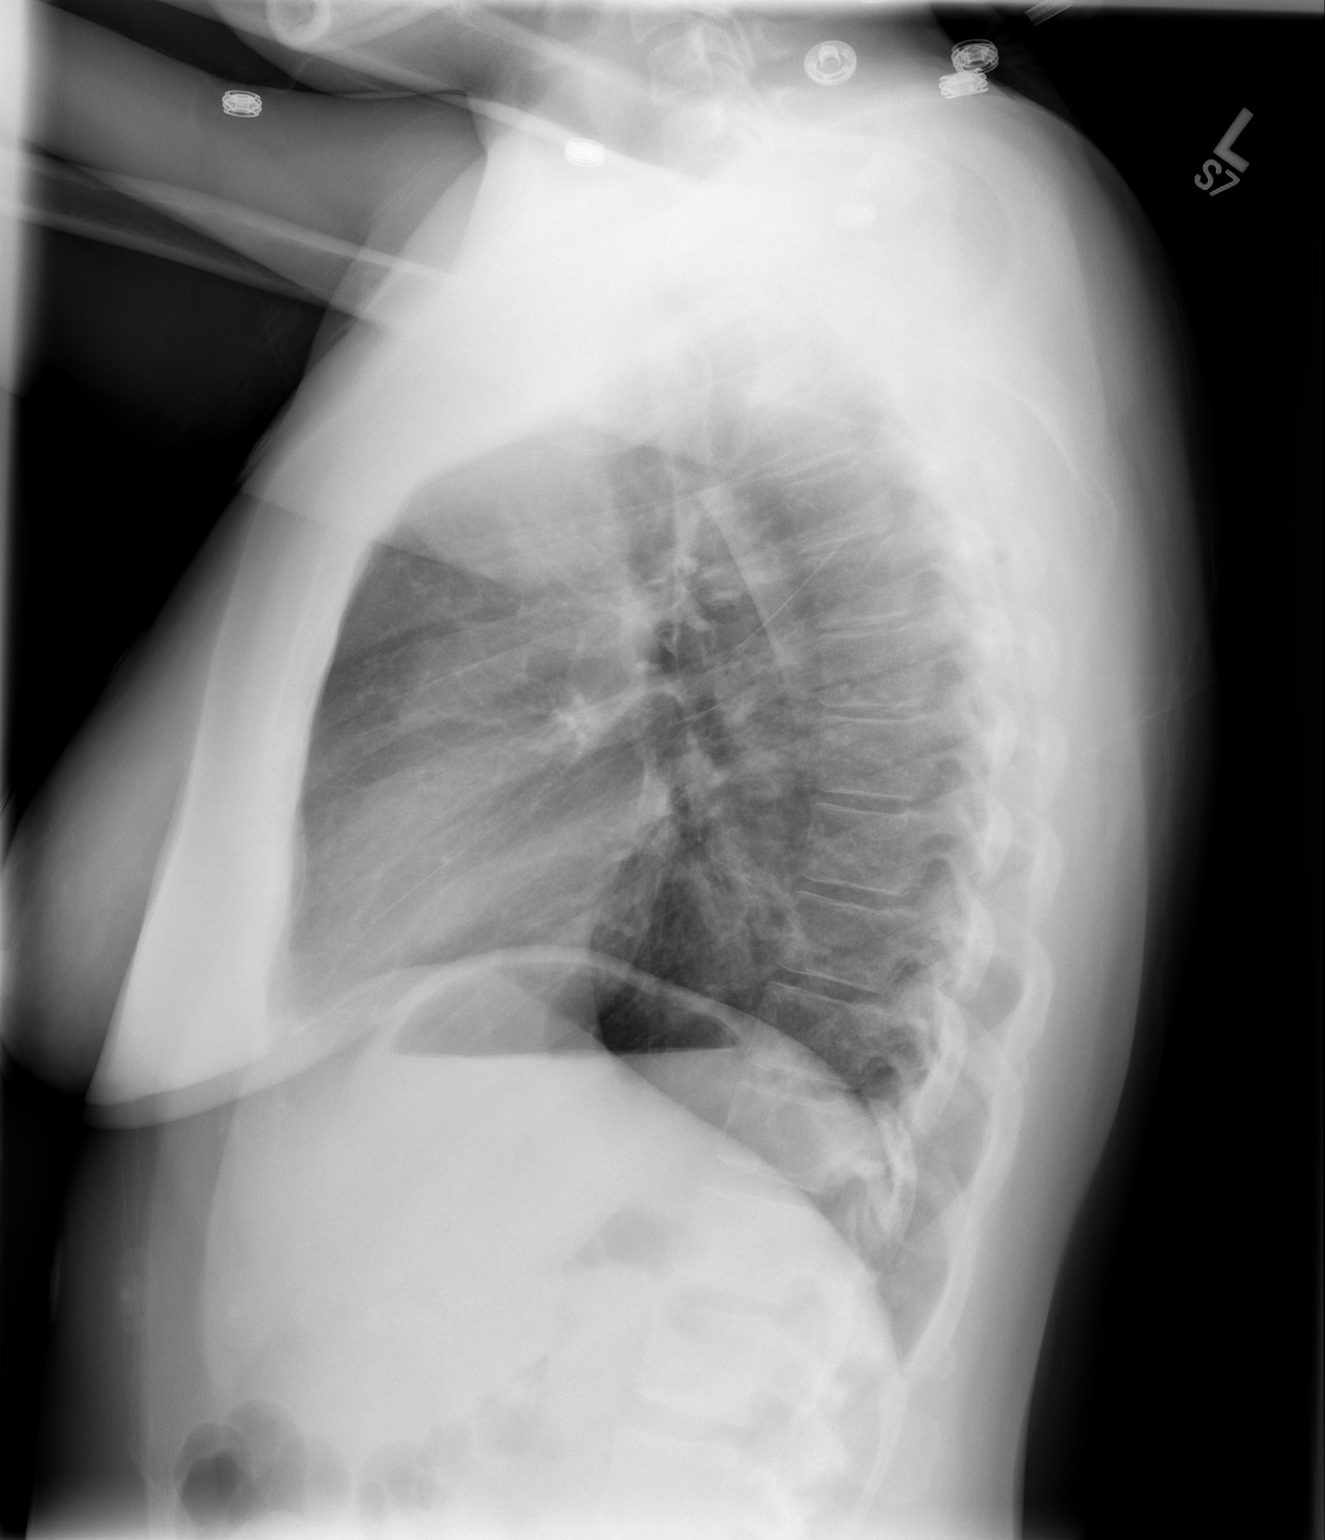

[2 of 2 positions shown; findings below may reference images not displayed]

FINDINGS: Lungs are clear.  No pneumothorax or pleural fluid.
Heart size is normal.  No bony abnormality.
IMPRESSION: Negative exam.

## 2013-10-20 ENCOUNTER — Other Ambulatory Visit: Payer: Self-pay | Admitting: Obstetrics & Gynecology

## 2013-10-20 ENCOUNTER — Ambulatory Visit
Admission: RE | Admit: 2013-10-20 | Discharge: 2013-10-20 | Disposition: A | Discharge status: Home / Self Care | Payer: Medicaid Other | Source: Ambulatory Visit | Attending: Obstetrics & Gynecology | Admitting: Obstetrics & Gynecology

## 2013-10-20 DIAGNOSIS — Z1231 Encounter for screening mammogram for malignant neoplasm of breast: Secondary | ICD-10-CM

## 2013-10-25 ENCOUNTER — Ambulatory Visit: Payer: Medicaid Other | Admitting: Obstetrics & Gynecology

## 2014-06-02 ENCOUNTER — Other Ambulatory Visit: Payer: Self-pay | Admitting: *Deleted

## 2014-06-02 DIAGNOSIS — Z3049 Encounter for surveillance of other contraceptives: Secondary | ICD-10-CM

## 2014-06-02 MED ORDER — ETONOGESTREL-ETHINYL ESTRADIOL 0.12-0.015 MG/24HR VA RING: VAGINAL_RING | VAGINAL | Status: DC

## 2014-06-20 ENCOUNTER — Ambulatory Visit (INDEPENDENT_AMBULATORY_CARE_PROVIDER_SITE_OTHER): Payer: BC Managed Care – PPO | Admitting: Obstetrics & Gynecology

## 2014-06-20 ENCOUNTER — Encounter: Payer: Self-pay | Admitting: Obstetrics & Gynecology

## 2014-06-20 VITALS — BP 126/82 | HR 83 | Temp 98.4°F | Wt 183.0 lb

## 2014-06-20 DIAGNOSIS — Z23 Encounter for immunization: Secondary | ICD-10-CM

## 2014-06-20 DIAGNOSIS — Z3202 Encounter for pregnancy test, result negative: Secondary | ICD-10-CM

## 2014-06-20 DIAGNOSIS — Z0000 Encounter for general adult medical examination without abnormal findings: Secondary | ICD-10-CM

## 2014-06-20 DIAGNOSIS — Z01419 Encounter for gynecological examination (general) (routine) without abnormal findings: Secondary | ICD-10-CM

## 2014-06-20 LAB — POCT URINE PREGNANCY: Preg Test, Ur: NEGATIVE

## 2014-06-20 NOTE — Progress Notes (Signed)
Subjective:     Angela Torres is a 43 y.o. female here for a routine exam.  Current complaints: none.    Personal health questionnaire:  Is patient Angela Torres, have a family history of breast and/or ovarian cancer: no Is there a family history of uterine cancer diagnosed at age < 68, gastrointestinal cancer, urinary tract cancer, family member who is a Personnel officer syndrome-associated carrier: yes Is the patient overweight and hypertensive, family history of diabetes, personal history of gestational diabetes or PCOS: no Is patient over 68, have PCOS,  family history of premature CHD under age 33, diabetes, smoke, have hypertension or peripheral artery disease: Yes.     [id number]} At any time, has a partner hit, kicked or otherwise hurt or frightened you?: no Over the past 2 weeks, have you felt down, depressed or hopeless?: no Over the past 2 weeks, have you felt little interest or pleasure in doing things?:no   Gynecologic History No LMP recorded. Patient is not currently having periods (Reason: Other). Contraception: NuvaRing vaginal inserts Last Pap results were: normal   Obstetric History OB History  No data available    History reviewed. No pertinent past medical history.  Past Surgical History  Procedure Laterality Date  . Foot surgery    . Wisdom tooth extraction Bilateral   . Tooth extraction Bilateral     Current outpatient prescriptions:Ascorbic Acid (VITAMIN C) 1000 MG tablet, Take 1,000 mg by mouth., Disp: , Rfl: ;  b complex vitamins tablet, Take 1 tablet by mouth daily., Disp: , Rfl: ;  Biotin 1000 MCG tablet, Take 1,000 mcg by mouth daily., Disp: , Rfl: ;  Calcium Carbonate-Vitamin D (CALCIUM + D PO), Take 1 tablet by mouth 2 (two) times daily., Disp: , Rfl:  etonogestrel-ethinyl estradiol (NUVARING) 0.12-0.015 MG/24HR vaginal ring, Insert vaginally and leave in place for 3 consecutive weeks, then remove for 1 week., Disp: 1 each, Rfl: 3;  magnesium 30 MG tablet,  Take 30 mg by mouth 2 (two) times daily., Disp: , Rfl: ;  Omega-3 Fatty Acids (FISH OIL PO), Take by mouth 2 (two) times daily., Disp: , Rfl: ;  vitamin E (VITAMIN E) 400 UNIT capsule, Take 400 Units by mouth 2 (two) times daily., Disp: , Rfl:  Allergies  Allergen Reactions  . Penicillins Anaphylaxis    History  Substance Use Topics  . Smoking status: Never Smoker   . Smokeless tobacco: Not on file  . Alcohol Use: Yes     Comment: social    Family History  Problem Relation Age of Onset  . Cancer Mother   . Asthma Mother   . COPD Mother   . Alcohol abuse Maternal Grandmother   . Heart disease Maternal Grandfather       Review of Systems  Constitutional: negative for fatigue and weight loss Respiratory: negative for cough and wheezing Cardiovascular: negative for chest pain, fatigue and palpitations Gastrointestinal: negative for abdominal pain and change in bowel habits Musculoskeletal:negative for myalgias Neurological: negative for gait problems and tremors Behavioral/Psych: negative for abusive relationship, depression Endocrine: negative for temperature intolerance   Genitourinary:negative for abnormal menstrual periods, genital lesions; positive forhot flashes; negative for sexual problems and vaginal discharge Integument/breast: negative for breast lump, breast tenderness, nipple discharge and skin lesion(s)    Objective:       BP 126/82  Pulse 83  Temp(Src) 98.4 F (36.9 C)  Wt 83.008 kg (183 lb) General:   Alert, oriented x 4 with no acute distressed observed  Skin:   no rash or abnormalities  Lungs:   clear to auscultation bilaterally  Heart:   regular rate and rhythm, S1, S2 normal, no murmur, click, rub or gallop  Breasts:   normal without suspicious masses, skin or nipple changes or axillary nodes  Abdomen:  normal findings: no organomegaly, soft, non-tender and no hernia  Pelvis:  External genitalia: normal general appearance Urinary system: urethral  meatus normal and bladder without fullness, nontender Vaginal: normal without tenderness, induration or masses Cervix: normal appearance Adnexa: normal bimanual exam Uterus: anteverted and non-tender, normal size   Lab Review Urine pregnancy test Labs reviewed no Radiologic studies reviewed no    Assessment:    Healthy female exam.   Perimenopausal symptoms? Plan:    Education reviewed: calcium supplements, low fat, low cholesterol diet and weight bearing exercise. Orders Placed This Encounter  Procedures  . Flu Vaccine QUAD 36+ mos IM (Fluarix, Quad PF)  . POCT urine pregnancy  Flu vaccine Return as needed.

## 2014-06-21 LAB — PAP IG AND HPV HIGH-RISK: HPV DNA High Risk: NOT DETECTED

## 2014-06-22 NOTE — Patient Instructions (Signed)

## 2014-08-11 ENCOUNTER — Telehealth: Payer: Self-pay | Admitting: *Deleted

## 2014-08-11 NOTE — Telephone Encounter (Signed)
Patient reports bleeding issue with birth control. 11/12 11:10 Patient reports when she had her physical last month- her continuous Nuva ring was not in place as she thought. She did replace it and she started a cycle. She removed the replacement ring and had a cycle. She has since replaced her ring and she has continued to bleed with clots. It is not extreme and she is aware that her body is probably confused because she got off schedule. Patient encouraged to continue her ring and watch her bleeding- if she gets to 2 weeks- which will be next Tuesday- she will call back for help in controlling her bleeding.

## 2014-09-26 ENCOUNTER — Encounter: Payer: Self-pay | Admitting: *Deleted

## 2014-09-27 ENCOUNTER — Encounter: Payer: Self-pay | Admitting: Obstetrics & Gynecology

## 2014-12-29 ENCOUNTER — Other Ambulatory Visit: Payer: Self-pay | Admitting: *Deleted

## 2014-12-29 DIAGNOSIS — Z3049 Encounter for surveillance of other contraceptives: Secondary | ICD-10-CM

## 2014-12-29 MED ORDER — ETONOGESTREL-ETHINYL ESTRADIOL 0.12-0.015 MG/24HR VA RING: VAGINAL_RING | VAGINAL | Status: DC

## 2014-12-29 NOTE — Progress Notes (Unsigned)
Pharmacy contacted the office of behalf of the patient requesting a refill for the Nuvaring. Patient is up to date on her annual exam. Patient given enough refills to last until her next annual due.

## 2015-05-19 ENCOUNTER — Other Ambulatory Visit: Payer: Self-pay | Admitting: Obstetrics

## 2015-06-20 ENCOUNTER — Other Ambulatory Visit: Payer: Self-pay | Admitting: *Deleted

## 2015-06-20 MED ORDER — ETONOGESTREL-ETHINYL ESTRADIOL 0.12-0.015 MG/24HR VA RING: VAGINAL_RING | VAGINAL | Status: DC

## 2015-06-28 ENCOUNTER — Ambulatory Visit (INDEPENDENT_AMBULATORY_CARE_PROVIDER_SITE_OTHER): Payer: BLUE CROSS/BLUE SHIELD | Admitting: Certified Nurse Midwife

## 2015-06-28 ENCOUNTER — Encounter: Payer: Self-pay | Admitting: Certified Nurse Midwife

## 2015-06-28 VITALS — BP 111/77 | HR 75 | Temp 98.6°F | Ht 67.0 in | Wt 181.0 lb

## 2015-06-28 DIAGNOSIS — Z01419 Encounter for gynecological examination (general) (routine) without abnormal findings: Secondary | ICD-10-CM | POA: Diagnosis not present

## 2015-06-28 DIAGNOSIS — Z3049 Encounter for surveillance of other contraceptives: Secondary | ICD-10-CM

## 2015-06-28 DIAGNOSIS — Z23 Encounter for immunization: Secondary | ICD-10-CM | POA: Diagnosis not present

## 2015-06-28 MED ORDER — ETONOGESTREL-ETHINYL ESTRADIOL 0.12-0.015 MG/24HR VA RING: VAGINAL_RING | VAGINAL | Status: DC

## 2015-06-28 NOTE — Progress Notes (Signed)
Patient ID: NIASHA DEVINS, female   DOB: 09/05/1971, 44 y.o.   MRN: [id number]    Subjective:      VIDA NICOL is a 44 y.o. female here for a routine exam.  Current complaints: none.  Sexually active.  Has been using the Nuva Ring for cycle regulation.     Employed as a travel Engineer, civil (consulting).  Desires Flu shot.  Declines blood STD testing.     Personal health questionnaire:  Is patient [name], have a family history of breast and/or ovarian cancer: yes, Mother & 4 aunts had complete hysterectomies before age 54 years.  Is there a family history of uterine cancer diagnosed at age < 39, gastrointestinal cancer, urinary tract cancer, family member who is a Personnel officer syndrome-associated carrier: no Is the patient overweight and hypertensive, family history of diabetes, personal history of gestational diabetes, preeclampsia or PCOS: no Is patient over 110, have PCOS,  family history of premature CHD under age 43, diabetes, smoke, have hypertension or peripheral artery disease:  yes At any time, has a partner hit, kicked or otherwise hurt or frightened you?: no Over the past 2 weeks, have you felt down, depressed or hopeless?: no Over the past 2 weeks, have you felt little interest or pleasure in doing things?:no   Gynecologic History Patient's last menstrual period was 06/15/2015 (exact date). Contraception: NuvaRing vaginal inserts Last Pap: 05/2014. Results were: normal Last mammogram: 1/15. Results were: normal  Obstetric History OB History  No data available  G3P3  No past medical history on file.  Past Surgical History  Procedure Laterality Date  . Foot surgery    . Wisdom tooth extraction Bilateral   . Tooth extraction Bilateral      Current outpatient prescriptions:  .  Ascorbic Acid (VITAMIN C) 1000 MG tablet, Take 1,000 mg by mouth., Disp: , Rfl:  .  b complex vitamins tablet, Take 1 tablet by mouth daily., Disp: , Rfl:  .  Biotin 1000 MCG tablet, Take 1,000 mcg by  mouth daily., Disp: , Rfl:  .  Calcium Carbonate-Vitamin D (CALCIUM + D PO), Take 1 tablet by mouth 2 (two) times daily., Disp: , Rfl:  .  etonogestrel-ethinyl estradiol (NUVARING) 0.12-0.015 MG/24HR vaginal ring, INSERT VAGINALLY AND LEAVE IN PLACE FOR 3 CONSECUTIVE WEEKS THEN REMOVE FOR 1 WEEK., Disp: 1 each, Rfl: 1 .  magnesium 30 MG tablet, Take 30 mg by mouth 2 (two) times daily., Disp: , Rfl:  .  Omega-3 Fatty Acids (FISH OIL PO), Take by mouth 2 (two) times daily., Disp: , Rfl:  .  vitamin E (VITAMIN E) 400 UNIT capsule, Take 400 Units by mouth 2 (two) times daily., Disp: , Rfl:  Allergies  Allergen Reactions  . Penicillins Anaphylaxis    Social History  Substance Use Topics  . Smoking status: Never Smoker   . Smokeless tobacco: Not on file  . Alcohol Use: Yes     Comment: social    Family History  Problem Relation Age of Onset  . Cancer Mother   . Asthma Mother   . COPD Mother   . Alcohol abuse Maternal Grandmother   . Heart disease Maternal Grandfather       Review of Systems  Constitutional: negative for fatigue and weight loss Respiratory: negative for cough and wheezing Cardiovascular: negative for chest pain, fatigue and palpitations Gastrointestinal: negative for abdominal pain and change in bowel habits Musculoskeletal:negative for myalgias Neurological: negative for gait problems and tremors Behavioral/Psych: negative for abusive  relationship, depression Endocrine: negative for temperature intolerance   Genitourinary:negative for abnormal menstrual periods, genital lesions, hot flashes, sexual problems and vaginal discharge Integument/breast: negative for breast lump, breast tenderness, nipple discharge and skin lesion(s)    Objective:       BP 111/77 mmHg  Pulse 75  Temp(Src) 98.6 F (37 C)  Ht  (1.702 m)  Wt 181 lb (82.101 kg)  BMI 28.34 kg/m2  LMP 06/15/2015 (Exact Date) General:   alert  Skin:   no rash or abnormalities  Lungs:   clear to  auscultation bilaterally  Heart:   regular rate and rhythm, S1, S2 normal, no murmur, click, rub or gallop  Breasts:   normal without suspicious masses, skin or nipple changes or axillary nodes  Abdomen:  normal findings: no organomegaly, soft, non-tender and no hernia  Pelvis:  External genitalia: normal general appearance Urinary system: urethral meatus normal and bladder without fullness, nontender Vaginal: normal without tenderness, induration or masses Cervix: normal appearance Adnexa: normal bimanual exam Uterus: anteverted and non-tender, normal size   Lab Review Urine pregnancy test Labs reviewed yes Radiologic studies reviewed yes  50% of 30 min visit spent on counseling and coordination of care.   Assessment:    Healthy female exam.   Contraception management  Plan:    Education reviewed: calcium supplements, depression evaluation, low fat, low cholesterol diet, safe sex/STD prevention, self breast exams, skin cancer screening and weight bearing exercise. Contraception: NuvaRing vaginal inserts. Mammogram ordered. Follow up in: 1 year.   No orders of the defined types were placed in this encounter.   Orders Placed This Encounter  Procedures  . MM DIGITAL SCREENING BILATERAL    Standing Status: Future     Number of Occurrences:      Standing Expiration Date: 08/27/2016    Order Specific Question:  Reason for Exam (SYMPTOM  OR DIAGNOSIS REQUIRED)    Answer:  annual screening    Order Specific Question:  Is the patient pregnant?    Answer:  No    Order Specific Question:  Preferred imaging location?    Answer:  [facility]   Need to obtain previous records Possible management options include: Mirena IUD Follow up as needed.

## 2015-07-03 LAB — SURESWAB, VAGINOSIS/VAGINITIS PLUS
Atopobium vaginae: NOT DETECTED Log (cells/mL)
C. ALBICANS, DNA: NOT DETECTED
C. PARAPSILOSIS, DNA: NOT DETECTED
C. TRACHOMATIS RNA, TMA: NOT DETECTED
C. glabrata, DNA: NOT DETECTED
C. tropicalis, DNA: NOT DETECTED
Gardnerella vaginalis: <4.7 Log (cells/mL)
LACTOBACILLUS SPECIES: NOT DETECTED Log (cells/mL)
MEGASPHAERA SPECIES: NOT DETECTED Log (cells/mL)
N. GONORRHOEAE RNA, TMA: NOT DETECTED
T. vaginalis RNA, QL TMA: NOT DETECTED

## 2015-07-03 LAB — PAP, TP IMAGING W/ HPV RNA, RFLX HPV TYPE 16,18/45: HPV mRNA, High Risk: NOT DETECTED

## 2016-06-28 ENCOUNTER — Ambulatory Visit: Payer: BLUE CROSS/BLUE SHIELD | Admitting: Certified Nurse Midwife

## 2016-08-07 ENCOUNTER — Telehealth: Payer: Self-pay | Admitting: *Deleted

## 2016-08-07 NOTE — Telephone Encounter (Signed)
Pharmacy [facility] [address] pt. cancelled annual visit 09/29 for this year.. Please advise.Marland Kitchen..Marland Kitchen

## 2016-08-08 ENCOUNTER — Other Ambulatory Visit: Payer: Self-pay | Admitting: Certified Nurse Midwife

## 2016-08-08 DIAGNOSIS — Z3049 Encounter for surveillance of other contraceptives: Secondary | ICD-10-CM

## 2016-08-08 MED ORDER — ETONOGESTREL-ETHINYL ESTRADIOL 0.12-0.015 MG/24HR VA RING: VAGINAL_RING | VAGINAL | 4 refills | Status: DC

## 2016-08-08 NOTE — Telephone Encounter (Signed)
Please let her know that it has been refilled.  Please also let her know that she needs a Mammogram ASAP.  Thank you. R.Kerrilyn Azbill CNM

## 2016-08-27 ENCOUNTER — Ambulatory Visit: Payer: Medicaid Other | Admitting: Certified Nurse Midwife

## 2017-06-18 ENCOUNTER — Encounter: Payer: Self-pay | Admitting: Certified Nurse Midwife

## 2017-06-18 ENCOUNTER — Ambulatory Visit (INDEPENDENT_AMBULATORY_CARE_PROVIDER_SITE_OTHER): Payer: No Typology Code available for payment source | Admitting: Certified Nurse Midwife

## 2017-06-18 VITALS — BP 124/75 | HR 84 | Temp 98.5°F | Wt 184.6 lb

## 2017-06-18 DIAGNOSIS — Z3044 Encounter for surveillance of vaginal ring hormonal contraceptive device: Secondary | ICD-10-CM

## 2017-06-18 DIAGNOSIS — Z01419 Encounter for gynecological examination (general) (routine) without abnormal findings: Secondary | ICD-10-CM | POA: Diagnosis not present

## 2017-06-18 DIAGNOSIS — N898 Other specified noninflammatory disorders of vagina: Secondary | ICD-10-CM

## 2017-06-18 DIAGNOSIS — N941 Unspecified dyspareunia: Secondary | ICD-10-CM

## 2017-06-18 DIAGNOSIS — Z113 Encounter for screening for infections with a predominantly sexual mode of transmission: Secondary | ICD-10-CM | POA: Diagnosis not present

## 2017-06-18 DIAGNOSIS — Z3049 Encounter for surveillance of other contraceptives: Secondary | ICD-10-CM

## 2017-06-18 MED ORDER — OSPEMIFENE 60 MG PO TABS: 1.0000 | ORAL_TABLET | Freq: Every day | ORAL | 5 refills | Status: DC

## 2017-06-18 MED ORDER — FLIBANSERIN 100 MG PO TABS: 1.0000 | ORAL_TABLET | Freq: Every day | ORAL | 12 refills | Status: AC

## 2017-06-18 NOTE — Progress Notes (Signed)
Presents for AEX. Wants PAP and STD testing. Having vaginal dryness, irritation after sex, lack of interest and painful intercourse. She is using NuvaRing for [facility].

## 2017-06-19 ENCOUNTER — Encounter: Payer: Self-pay | Admitting: Certified Nurse Midwife

## 2017-06-19 ENCOUNTER — Other Ambulatory Visit: Payer: Self-pay | Admitting: Certified Nurse Midwife

## 2017-06-19 DIAGNOSIS — B3731 Acute candidiasis of vulva and vagina: Secondary | ICD-10-CM

## 2017-06-19 DIAGNOSIS — B373 Candidiasis of vulva and vagina: Secondary | ICD-10-CM

## 2017-06-19 DIAGNOSIS — N941 Unspecified dyspareunia: Secondary | ICD-10-CM | POA: Insufficient documentation

## 2017-06-19 LAB — CERVICOVAGINAL ANCILLARY ONLY
BACTERIAL VAGINITIS: NEGATIVE
CANDIDA VAGINITIS: POSITIVE — AB
CHLAMYDIA, DNA PROBE: NEGATIVE
NEISSERIA GONORRHEA: NEGATIVE
TRICH (WINDOWPATH): NEGATIVE

## 2017-06-19 MED ORDER — FLUCONAZOLE 200 MG PO TABS: 200.0000 mg | ORAL_TABLET | Freq: Once | ORAL | 0 refills | Status: AC

## 2017-06-19 MED ORDER — OSPEMIFENE 60 MG PO TABS: 1.0000 | ORAL_TABLET | Freq: Every day | ORAL | 5 refills | Status: AC

## 2017-06-19 MED ORDER — TERCONAZOLE 0.8 % VA CREA: 1.0000 | TOPICAL_CREAM | Freq: Every day | VAGINAL | 0 refills | Status: AC

## 2017-06-19 MED ORDER — ETONOGESTREL-ETHINYL ESTRADIOL 0.12-0.015 MG/24HR VA RING: VAGINAL_RING | VAGINAL | 4 refills | Status: AC

## 2017-06-19 NOTE — Progress Notes (Signed)
Subjective:        Angela Torres is a 46 y.o. female here for a routine exam.  Current complaints: vaginal dryness and irritation with sexual intercourse, states pain with sexual intercourse after orgasm or around 15 minues.  Does not use lubricant, does engage in foreplay.    Has used Nuva Ring for about 10 years.  Discussed different contraception options.  States that she has normal periods with Nuva Ring.  Denies any hot flashes.  But does report mood swings.  Does not feel "herself".  States that she also has lack of sexual desire, this is not the normal for her.  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  Has 3 grandchildren at home and stays with them during the week and works on the weekends.  Has been married for over 20 years to the same partner.  Partner desires more sexual intercourse.  Patient encouraged to discuss this with her partner.    Personal health questionnaire:  Is patient [name], have a family history of breast and/or ovarian cancer: no Is there a family history of uterine cancer diagnosed at age < 33, gastrointestinal cancer, urinary tract cancer, family member who is a Personnel officer syndrome-associated carrier: no Is the patient overweight and hypertensive, family history of diabetes, personal history of gestational diabetes, preeclampsia or PCOS: no Is patient over 45, have PCOS,  family history of premature CHD under age 52, diabetes, smoke, have hypertension or peripheral artery disease:  no At any time, has a partner hit, kicked or otherwise hurt or frightened you?: no Over the past 2 weeks, have you felt down, depressed or hopeless?: no Over the past 2 weeks, have you felt little interest or pleasure in doing things?:no   Gynecologic History Patient's last menstrual period was 04/23/2017 (exact date). Contraception: NuvaRing vaginal inserts Last Pap: 06/28/15. Results were: normal Last mammogram: 10/20/13. Results were: normal; states has had  mammograms at [facility], not in our system  Obstetric History OB History  No data available    History reviewed. No pertinent past medical history.  Past Surgical History:  Procedure Laterality Date  . FOOT SURGERY    . TOOTH EXTRACTION Bilateral   . WISDOM TOOTH EXTRACTION Bilateral      Current Outpatient Prescriptions:  .  Calcium Carbonate-Vitamin D (CALCIUM + D PO), Take 1 tablet by mouth 2 (two) times daily., Disp: , Rfl:  .  Omega-3 Fatty Acids (FISH OIL PO), Take by mouth 2 (two) times daily., Disp: , Rfl:  .  Ascorbic Acid (VITAMIN C) 1000 MG tablet, Take 1,000 mg by mouth., Disp: , Rfl:  .  b complex vitamins tablet, Take 1 tablet by mouth daily., Disp: , Rfl:  .  Biotin 1000 MCG tablet, Take 1,000 mcg by mouth daily., Disp: , Rfl:  .  etonogestrel-ethinyl estradiol (NUVARING) 0.12-0.015 MG/24HR vaginal ring, INSERT VAGINALLY AND LEAVE IN PLACE FOR 3 CONSECUTIVE WEEKS THEN REMOVE FOR 1 WEEK., Disp: 3 each, Rfl: 4 .  Flibanserin (ADDYI) 100 MG TABS, Take 1 tablet by mouth daily., Disp: 30 tablet, Rfl: 12 .  magnesium 30 MG tablet, Take 30 mg by mouth 2 (two) times daily., Disp: , Rfl:  .  Ospemifene (OSPHENA) 60 MG TABS, Take 1 tablet by mouth daily., Disp: 90 tablet, Rfl: 5 .  vitamin E (VITAMIN E) 400 UNIT capsule, Take 400 Units by mouth 2 (two) times daily., Disp: , Rfl:  Allergies  Allergen Reactions  . Penicillins  Anaphylaxis    Social History  Substance Use Topics  . Smoking status: Never Smoker  . Smokeless tobacco: Never Used  . Alcohol use Yes     Comment: social    Family History  Problem Relation Age of Onset  . Cancer Mother   . Asthma Mother   . COPD Mother   . Alcohol abuse Maternal Grandmother   . Heart disease Maternal Grandfather       Review of Systems  Constitutional: negative for fatigue and weight loss Respiratory: negative for cough and wheezing Cardiovascular: negative for chest pain, fatigue and palpitations Gastrointestinal: negative  for abdominal pain and change in bowel habits Musculoskeletal:negative for myalgias Neurological: negative for gait problems and tremors Behavioral/Psych: negative for abusive relationship, depression Endocrine: negative for temperature intolerance    Genitourinary:negative for abnormal menstrual periods, genital lesions, hot flashes, sexual problems and vaginal discharge Integument/breast: negative for breast lump, breast tenderness, nipple discharge and skin lesion(s)    Objective:       BP 124/75   Pulse 84   Temp 98.5 F (36.9 C)   Wt 184 lb 9.6 oz (83.7 kg)   LMP 04/23/2017 (Exact Date)   BMI 28.91 kg/m  General:   alert  Skin:   no rash or abnormalities  Lungs:   clear to auscultation bilaterally  Heart:   regular rate and rhythm, S1, S2 normal, no murmur, click, rub or gallop  Breasts:   normal without suspicious masses, skin or nipple changes or axillary nodes  Abdomen:  normal findings: no organomegaly, soft, non-tender and no hernia  Pelvis:  External genitalia: normal general appearance Urinary system: urethral meatus normal and bladder without fullness, nontender Vaginal: normal without tenderness, induration or masses, friable, absence of rugae Cervix: normal appearance Adnexa: normal bimanual exam Uterus: anteverted and non-tender, normal size   Lab Review Urine pregnancy test Labs reviewed yes Radiologic studies reviewed yes  50% of 45 min visit spent on counseling and coordination of care.    Assessment & Plan    Healthy female exam.   1. Well woman exam     OTC MTV, OTC probiotic    Perimenopausal status - Cytology - PAP - Cervicovaginal ancillary only - MM DIGITAL SCREENING BILATERAL; Future  2. Dyspareunia in female     - Cervicovaginal ancillary only - Ospemifene (OSPHENA) 60 MG TABS; Take 1 tablet by mouth daily.  Dispense: 90 tablet; Refill: 5 - Flibanserin (ADDYI) 100 MG TABS; Take 1 tablet by mouth daily.  Dispense: 30 tablet; Refill:  12  3. Screen for STD (sexually transmitted disease)     - Cervicovaginal ancillary only  4. Vaginal irritation    - Cervicovaginal ancillary only  5. Encounter for surveillance of vaginal ring hormonal contraceptive device     - etonogestrel-ethinyl estradiol (NUVARING) 0.12-0.015 MG/24HR vaginal ring; INSERT VAGINALLY AND LEAVE IN PLACE FOR 3 CONSECUTIVE WEEKS THEN REMOVE FOR 1 WEEK.  Dispense: 3 each; Refill: 4    Education reviewed: calcium supplements, depression evaluation, low fat, low cholesterol diet, safe sex/STD prevention, self breast exams, skin cancer screening and weight bearing exercise. Contraception: NuvaRing vaginal inserts. Mammogram ordered. Follow up in: 1 month for IUD Mirena insertion.   Meds ordered this encounter  Medications  . Ospemifene (OSPHENA) 60 MG TABS    Sig: Take 1 tablet by mouth daily.    Dispense:  90 tablet    Refill:  5  . Flibanserin (ADDYI) 100 MG TABS    Sig: Take 1  tablet by mouth daily.    Dispense:  30 tablet    Refill:  12  . etonogestrel-ethinyl estradiol (NUVARING) 0.12-0.015 MG/24HR vaginal ring    Sig: INSERT VAGINALLY AND LEAVE IN PLACE FOR 3 CONSECUTIVE WEEKS THEN REMOVE FOR 1 WEEK.    Dispense:  3 each    Refill:  4    Please dispense a 90 day supply.   Orders Placed This Encounter  Procedures  . MM DIGITAL SCREENING BILATERAL    Standing Status:   Future    Standing Expiration Date:   08/19/2018    Order Specific Question:   Reason for Exam (SYMPTOM  OR DIAGNOSIS REQUIRED)    Answer:   annaual exam    Order Specific Question:   Is the patient pregnant?    Answer:   No    Order Specific Question:   Preferred imaging location?    Answer:   External    Comments:   [facility]    Possible management options include:  counseling Follow up as needed.

## 2017-06-20 ENCOUNTER — Encounter: Payer: Self-pay | Admitting: *Deleted

## 2017-06-20 LAB — CYTOLOGY - PAP
Diagnosis: NEGATIVE
HPV: NOT DETECTED

## 2017-06-24 ENCOUNTER — Other Ambulatory Visit: Payer: Self-pay | Admitting: Certified Nurse Midwife

## 2017-07-16 ENCOUNTER — Ambulatory Visit: Payer: No Typology Code available for payment source | Admitting: Certified Nurse Midwife

## 2023-11-11 ENCOUNTER — Encounter: Payer: No Typology Code available for payment source | Admitting: Family Medicine
